# Patient Record
Sex: Female | Born: 1995 | Race: White | Hispanic: No | Marital: Single | Smoking: Never smoker
Health system: Southern US, Community
[De-identification: ages and names within clinical notes are randomized; demographics above are authoritative.]

## PROBLEM LIST (undated history)

## (undated) DIAGNOSIS — IMO0001 Reserved for inherently not codable concepts without codable children: Secondary | ICD-10-CM

## (undated) DIAGNOSIS — N939 Abnormal uterine and vaginal bleeding, unspecified: Secondary | ICD-10-CM

## (undated) DIAGNOSIS — Z23 Encounter for immunization: Secondary | ICD-10-CM

## (undated) HISTORY — DX: Abnormal uterine and vaginal bleeding, unspecified: N93.9

## (undated) HISTORY — PX: APPENDECTOMY: SHX54

## (undated) HISTORY — PX: WISDOM TOOTH EXTRACTION: SHX21

## (undated) HISTORY — DX: Encounter for immunization: Z23

## (undated) HISTORY — DX: Reserved for inherently not codable concepts without codable children: IMO0001

---
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
## 2015-08-04 ENCOUNTER — Telehealth: Payer: Self-pay

## 2015-08-04 MED ORDER — TYPHOID VACCINE PO CPDR: 1.0000 | DELAYED_RELEASE_CAPSULE | ORAL | Status: DC

## 2015-08-04 NOTE — Telephone Encounter (Signed)
Rx called in to pharmacy. 

## 2015-08-04 NOTE — Telephone Encounter (Signed)
Patient called and states she is going to [place] and needs Typhoid and [facility] has this oral RX but needs us to call them and get this authorized for her. Please revieww-[id number]

## 2015-08-04 NOTE — Telephone Encounter (Signed)
OK to call in oral typhoid vaccine, one capsule every other day * 4.

## 2016-07-19 ENCOUNTER — Telehealth: Payer: Self-pay | Admitting: Family Medicine

## 2016-07-19 NOTE — Telephone Encounter (Signed)
Pt called requesting a copy of her immunization records and would like for her mother to pick records up on Wednesday or Thursday of this week. Pt.'s CB# [phone] Thanks CC

## 2016-07-19 NOTE — Telephone Encounter (Signed)
Immunization record printed and patient has been notified. KW

## 2017-01-04 ENCOUNTER — Other Ambulatory Visit: Payer: Self-pay

## 2017-01-04 MED ORDER — NORGESTIM-ETH ESTRAD TRIPHASIC 0.18/0.215/0.25 MG-35 MCG PO TABS: 1.0000 | ORAL_TABLET | Freq: Every day | ORAL | 3 refills | Status: AC

## 2017-01-04 NOTE — Telephone Encounter (Signed)
Pt calling for refill of Triprevifem thru to appt due in Sept.  Pt aware refills eRx'd.

## 2017-03-01 ENCOUNTER — Ambulatory Visit (INDEPENDENT_AMBULATORY_CARE_PROVIDER_SITE_OTHER): Payer: 59 | Admitting: Family Medicine

## 2017-03-01 ENCOUNTER — Encounter: Payer: Self-pay | Admitting: Family Medicine

## 2017-03-01 VITALS — BP 104/60 | HR 72 | Temp 98.8°F | Resp 16 | Wt 130.0 lb

## 2017-03-01 DIAGNOSIS — R109 Unspecified abdominal pain: Secondary | ICD-10-CM

## 2017-03-01 DIAGNOSIS — F419 Anxiety disorder, unspecified: Secondary | ICD-10-CM

## 2017-03-01 MED ORDER — CLONAZEPAM 0.5 MG PO TABS: 0.2500 mg | ORAL_TABLET | Freq: Two times a day (BID) | ORAL | 0 refills | Status: AC | PRN

## 2017-03-01 MED ORDER — SERTRALINE HCL 25 MG PO TABS: ORAL_TABLET | ORAL | 1 refills | Status: DC

## 2017-03-01 NOTE — Progress Notes (Signed)
       Patient: Patricia Mercer Female    DOB: 10/17/1995   21 y.o.   MRN: [id number] Visit Date: 03/01/2017  Today's Provider: Mila Merryonald Fisher, MD   Chief Complaint  Patient presents with  . Gastroesophageal Reflux   Subjective:    Gastroesophageal Reflux  She complains of abdominal pain and nausea. She reports no chest pain. This is a chronic problem. Pertinent negatives include no fatigue.   Anxiety:  Patient presents day requesting a prescription to help with Anxiety. Patient reports that she was prescribed clonazepam several years years ago while in college. She prefers to take a maintenance medications that won't make her drowsy. 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 She feels her job is going well, she just stays anxious all the time which she thinks is causing some of her GI problems. She has been seeing a counselor regularly and discussed medications. She denies feeling depressed.     Allergies not on file   Current Outpatient Prescriptions:  .  Norgestimate-Ethinyl Estradiol Triphasic (TRI-PREVIFEM) 0.18/0.215/0.25 MG-35 MCG tablet, Take 1 tablet by mouth daily., Disp: 1 Package, Rfl: 3  Review of Systems  Constitutional: Negative for appetite change, chills, fatigue and fever.  Respiratory: Negative for chest tightness and shortness of breath.   Cardiovascular: Negative for chest pain and palpitations.  Gastrointestinal: Positive for abdominal pain, constipation, nausea and vomiting (started 2 weeks ago). Negative for diarrhea.       Reflux  Neurological: Negative for dizziness and weakness.  Psychiatric/Behavioral: Negative for agitation, confusion, decreased concentration, dysphoric mood, self-injury, sleep disturbance and suicidal ideas. The patient is nervous/anxious.     Social History  Substance Use Topics  . Smoking status: Never Smoker  . Smokeless tobacco: Never Used  . Alcohol use Yes     Comment: drinks daily   Objective:   BP  104/60 (BP Location: Left Arm, Patient Position: Sitting, Cuff Size: Normal)   Pulse 72   Temp 98.8 F (37.1 C) (Oral)   Resp 16   Wt 130 lb (59 kg)   LMP  (Within Months)   SpO2 99%  Vitals:   03/01/17 1559  BP: 104/60  Pulse: 72  Resp: 16  Temp: 98.8 F (37.1 C)  TempSrc: Oral  SpO2: 99%  Weight: 130 lb (59 kg)     Physical Exam  General appearance: alert, well developed, well nourished, cooperative and in no distress Head: Normocephalic, without obvious abnormality, atraumatic Respiratory: Respirations even and unlabored, normal respiratory rate Extremities: No gross deformities Skin: Skin color, texture, turgor normal. No rashes seen  Psych: Appropriate mood and affect. Neurologic: Mental status: Alert, oriented to person, place, and time, thought content appropriate.     Assessment & Plan:     1. Anxiety Start very low dose SSRI and titrate slowly.  - sertraline (ZOLOFT) 25 MG tablet; 1/2 tablet daily for 6days, then 1 tablet daily for 6 days, then 1 1/2 tablets daily for 6 days, then 2 tablets daily  Dispense: 30 tablet; Refill: 1 - clonazePAM (KLONOPIN) 0.5 MG tablet; Take 0.5 tablets (0.25 mg total) by mouth 2 (two) times daily as needed for anxiety.  Dispense: 20 tablet; Refill: 0  2. Abdominal pain, unspecified abdominal location Long standing and aggravated by anxiety. Further evaluation If not improved when anxiety is better controlled.        Mila Merryonald Fisher, MD  [facility]

## 2017-03-28 ENCOUNTER — Ambulatory Visit: Payer: Self-pay | Admitting: Family Medicine

## 2017-03-30 ENCOUNTER — Other Ambulatory Visit: Payer: Self-pay | Admitting: Family Medicine

## 2017-03-30 DIAGNOSIS — F419 Anxiety disorder, unspecified: Secondary | ICD-10-CM

## 2017-03-30 NOTE — Telephone Encounter (Signed)
Received refill request for sertraline, please check with patient to see how this is working and if she has titrated up to 2 a day, if so I can change prescription to 50mg  so she only has to take one a day.

## 2017-03-30 NOTE — Telephone Encounter (Signed)
LMTCB

## 2017-04-04 NOTE — Telephone Encounter (Signed)
Left message to call back  

## 2017-04-13 ENCOUNTER — Telehealth: Payer: Self-pay | Admitting: Family Medicine

## 2017-04-13 NOTE — Telephone Encounter (Signed)
Pt returned Michelles call  Thanks teri

## 2017-04-13 NOTE — Telephone Encounter (Signed)
Returned call

## 2017-04-13 NOTE — Telephone Encounter (Signed)
Patient stated that she is still only taking 1/2 tablet of sertraline 25 mg qd. This seems to be working fine for her.

## 2017-04-13 NOTE — Telephone Encounter (Signed)
received refill request for sertraline that was prescribed in July. Please see if she is taking 1 or 2 tablets a day and how medication is working.

## 2017-04-17 ENCOUNTER — Other Ambulatory Visit: Payer: Self-pay | Admitting: Family Medicine

## 2017-04-17 DIAGNOSIS — F419 Anxiety disorder, unspecified: Secondary | ICD-10-CM

## 2017-04-17 MED ORDER — SERTRALINE HCL 25 MG PO TABS: 12.5000 mg | ORAL_TABLET | Freq: Every day | ORAL | 5 refills | Status: DC

## 2017-04-17 NOTE — Telephone Encounter (Signed)
Pharmacy called for refill on pt's sertraline 25mg  bid  [facility] [address]  [phone]  Thanks Barth Kirksteri

## 2017-04-17 NOTE — Telephone Encounter (Signed)
See previous refill message in chart, concerning dose pt is taking.

## 2017-04-19 NOTE — Telephone Encounter (Signed)
Patient stated that she is still only taking 1/2 tablet of sertraline 25 mg qd. This seems to be working fine for her.

## 2017-04-20 ENCOUNTER — Other Ambulatory Visit: Payer: Self-pay | Admitting: Family Medicine

## 2017-04-20 DIAGNOSIS — F419 Anxiety disorder, unspecified: Secondary | ICD-10-CM

## 2017-07-07 DIAGNOSIS — M9902 Segmental and somatic dysfunction of thoracic region: Secondary | ICD-10-CM | POA: Diagnosis not present

## 2017-07-07 DIAGNOSIS — M542 Cervicalgia: Secondary | ICD-10-CM | POA: Diagnosis not present

## 2017-07-07 DIAGNOSIS — M9901 Segmental and somatic dysfunction of cervical region: Secondary | ICD-10-CM | POA: Diagnosis not present

## 2017-07-10 DIAGNOSIS — M9902 Segmental and somatic dysfunction of thoracic region: Secondary | ICD-10-CM | POA: Diagnosis not present

## 2017-07-10 DIAGNOSIS — M9901 Segmental and somatic dysfunction of cervical region: Secondary | ICD-10-CM | POA: Diagnosis not present

## 2017-07-10 DIAGNOSIS — M542 Cervicalgia: Secondary | ICD-10-CM | POA: Diagnosis not present

## 2017-07-12 DIAGNOSIS — M9902 Segmental and somatic dysfunction of thoracic region: Secondary | ICD-10-CM | POA: Diagnosis not present

## 2017-07-12 DIAGNOSIS — M542 Cervicalgia: Secondary | ICD-10-CM | POA: Diagnosis not present

## 2017-07-12 DIAGNOSIS — M9901 Segmental and somatic dysfunction of cervical region: Secondary | ICD-10-CM | POA: Diagnosis not present

## 2017-07-19 DIAGNOSIS — M542 Cervicalgia: Secondary | ICD-10-CM | POA: Diagnosis not present

## 2017-07-19 DIAGNOSIS — M9902 Segmental and somatic dysfunction of thoracic region: Secondary | ICD-10-CM | POA: Diagnosis not present

## 2017-07-19 DIAGNOSIS — M9901 Segmental and somatic dysfunction of cervical region: Secondary | ICD-10-CM | POA: Diagnosis not present

## 2017-08-02 ENCOUNTER — Encounter: Payer: Self-pay | Admitting: Obstetrics and Gynecology

## 2017-08-02 ENCOUNTER — Ambulatory Visit (INDEPENDENT_AMBULATORY_CARE_PROVIDER_SITE_OTHER): Payer: 59 | Admitting: Obstetrics and Gynecology

## 2017-08-02 VITALS — BP 104/74 | Ht 62.0 in | Wt 134.0 lb

## 2017-08-02 DIAGNOSIS — N912 Amenorrhea, unspecified: Secondary | ICD-10-CM | POA: Diagnosis not present

## 2017-08-02 DIAGNOSIS — Z113 Encounter for screening for infections with a predominantly sexual mode of transmission: Secondary | ICD-10-CM | POA: Diagnosis not present

## 2017-08-02 LAB — POCT URINE PREGNANCY: PREG TEST UR: NEGATIVE

## 2017-08-02 MED ORDER — MEDROXYPROGESTERONE ACETATE 10 MG PO TABS: 10.0000 mg | ORAL_TABLET | Freq: Every day | ORAL | 0 refills | Status: AC

## 2017-08-02 NOTE — Patient Instructions (Signed)
I value your feedback and entrusting us with your care. If you get a [facility] patient survey, I would appreciate you taking the time to let us know about your experience today. Thank you! 

## 2017-08-02 NOTE — Progress Notes (Signed)
Chief Complaint  Patient presents with  . Menstrual Problem    irregular cycles    HPI:      Ms. Patricia Mercer is a 21 y.o. [id number] who LMP was Patient's last menstrual period was 03/07/2017 (approximate)., presents today for amenorrhea. Pt had monthly menses on OCPs, which she stopped 9/18, and no menses since. Hx of Q3 month menses off BC in the past. Pt isn't good about picking up Rx/taking pills daily so stopped them. No increased acne/unusual facial hair.   Never had PCOS labs in past. No recent significant wt change, new meds, increased stress. No vag sx, urin sx, pelvic pain.  Pt is sex active, no using any condoms. Had neg UPTs at home. Just wants to make sure ok she hasn't had periods. Doesn't want labs today. Is interested in just restarting OCPs.    Past Medical History:  Diagnosis Date  . Abnormal uterine bleeding   . 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 APPENDECTOMY    . WISDOM TOOTH EXTRACTION      Family History  Problem Relation Age of Onset  . Cancer Neg Hx   . Heart failure Neg Hx   . Diabetes Neg Hx   . Stroke Neg Hx   . Thyroid disease Neg Hx     Social History   Socioeconomic History  . Marital status: Single    Spouse name: Not on file  . Number of children: Not on file  . Years of education: Not on file  . Highest education level: Not on file  Social Needs  . Financial resource strain: Not on file  . Food insecurity - worry: Not on file  . Food insecurity - inability: Not on file  . Transportation needs - medical: Not on file  . Transportation needs - non-medical: Not on file  Occupational History  . Not on file  Tobacco Use  . Smoking status: Never Smoker  . Smokeless tobacco: Never Used  Substance and Sexual Activity  . Alcohol use: Yes    Comment: drinks daily  . Drug use: No  . Sexual activity: Yes    Birth control/protection:  None  Other Topics Concern  . Not on file  Social History Narrative  . Not on file     Current Outpatient Medications:  .  clonazePAM (KLONOPIN) 0.5 MG tablet, Take 0.5 tablets (0.25 mg total) by mouth 2 (two) times daily as needed for anxiety., Disp: 20 tablet, Rfl: 0 .  sertraline (ZOLOFT) 25 MG tablet, Take 0.5 tablets (12.5 mg total) by mouth daily., Disp: 30 tablet, Rfl: 5 .  medroxyPROGESTERone (PROVERA) 10 MG tablet, Take 1 tablet (10 mg total) by mouth daily for 7 days., Disp: 7 tablet, Rfl: 0 .  Norgestimate-Ethinyl Estradiol Triphasic (TRI-PREVIFEM) 0.18/0.215/0.25 MG-35 MCG tablet, Take 1 tablet by mouth daily. (Patient not taking: Reported on 08/02/2017), Disp: 1 Package, Rfl: 3   ROS:  Review of Systems  Constitutional: Negative for fever.  Gastrointestinal: Negative for blood in stool, constipation, diarrhea, nausea and vomiting.  Genitourinary: Positive for menstrual problem. Negative for dyspareunia, dysuria, flank pain, frequency, hematuria, urgency, vaginal bleeding, vaginal discharge and vaginal pain.  Musculoskeletal: Negative for back pain.  Skin: Negative for rash.     OBJECTIVE:   Vitals:  BP 104/74   Ht 5\' 2"  (1.575 m)   Wt 134 lb (60.8 kg)  LMP 03/07/2017 (Approximate) Comment: irregular cycles  BMI 24.51 kg/m   Physical Exam  Constitutional: She is oriented to person, place, and time and well-developed, well-nourished, and in no distress. Vital signs are normal.  Genitourinary: Vagina normal, uterus normal, cervix normal, right adnexa normal, left adnexa normal and vulva normal. Uterus is not enlarged. Cervix exhibits no motion tenderness and no tenderness. Right adnexum displays no mass and no tenderness. Left adnexum displays no mass and no tenderness. Vulva exhibits no erythema, no exudate, no lesion, no rash and no tenderness. Vagina exhibits no lesion.  Neurological: She is oriented to person, place, and time.  Vitals  reviewed.   Results: Results for orders placed or performed in visit on 08/02/17 (from the past 24 hour(s))  POCT urine pregnancy     Status: Normal   Collection Time: 08/02/17 10:17 AM  Result Value Ref Range   Preg Test, Ur Negative Negative     Assessment/Plan: Amenorrhea - Neg UPT. Hx of irreg menses off OCPs in the past. Pt declines PCOS labs today. No hirsutism sx. Rx provera. OCP restart with withdrawal bleed. Pt has Rx. - Plan: POCT urine pregnancy, medroxyPROGESTERone (PROVERA) 10 MG tablet  Screening for STD (sexually transmitted disease) - Plan: Chlamydia/Gonococcus/Trichomonas, NAA    Meds ordered this encounter  Medications  . medroxyPROGESTERone (PROVERA) 10 MG tablet    Sig: Take 1 tablet (10 mg total) by mouth daily for 7 days.    Dispense:  7 tablet    Refill:  0      Return if symptoms worsen or fail to improve.  Alicia B. Copland, PA-C 08/02/2017 11:05 AM

## 2017-08-04 LAB — CHLAMYDIA/GONOCOCCUS/TRICHOMONAS, NAA
CHLAMYDIA BY NAA: NEGATIVE
Gonococcus by NAA: NEGATIVE
TRICH VAG BY NAA: NEGATIVE

## 2017-08-17 ENCOUNTER — Other Ambulatory Visit: Payer: Self-pay | Admitting: Family Medicine

## 2017-08-17 DIAGNOSIS — F419 Anxiety disorder, unspecified: Secondary | ICD-10-CM

## 2017-08-17 MED ORDER — SERTRALINE HCL 25 MG PO TABS: 12.5000 mg | ORAL_TABLET | Freq: Every day | ORAL | 3 refills | Status: DC

## 2017-08-17 NOTE — Telephone Encounter (Signed)
Requesting 90 day supply.

## 2017-08-17 NOTE — Telephone Encounter (Signed)
[facility] [place] fax[facility] refill request for the following medications:  sertraline (ZOLOFT) 25 MG tablet  90 day supply   Last Rx: 04/20/17 30 day supply with 5 refills LOV: 03/01/17 Please advise. Thanks TNP

## 2017-08-21 ENCOUNTER — Other Ambulatory Visit: Payer: Self-pay | Admitting: Family Medicine

## 2017-08-21 DIAGNOSIS — F419 Anxiety disorder, unspecified: Secondary | ICD-10-CM

## 2017-08-21 MED ORDER — SERTRALINE HCL 25 MG PO TABS: 12.5000 mg | ORAL_TABLET | Freq: Every day | ORAL | 5 refills | Status: AC

## 2017-08-21 NOTE — Telephone Encounter (Signed)
[facility] supply for the following medication. Thanks CC  sertraline (ZOLOFT) 25 MG tablet

## 2017-09-12 DIAGNOSIS — M542 Cervicalgia: Secondary | ICD-10-CM | POA: Diagnosis not present

## 2017-09-12 DIAGNOSIS — M9902 Segmental and somatic dysfunction of thoracic region: Secondary | ICD-10-CM | POA: Diagnosis not present

## 2017-09-12 DIAGNOSIS — M9901 Segmental and somatic dysfunction of cervical region: Secondary | ICD-10-CM | POA: Diagnosis not present

## 2017-09-14 DIAGNOSIS — M542 Cervicalgia: Secondary | ICD-10-CM | POA: Diagnosis not present

## 2017-09-14 DIAGNOSIS — M9902 Segmental and somatic dysfunction of thoracic region: Secondary | ICD-10-CM | POA: Diagnosis not present

## 2017-09-14 DIAGNOSIS — M9901 Segmental and somatic dysfunction of cervical region: Secondary | ICD-10-CM | POA: Diagnosis not present

## 2017-10-31 DIAGNOSIS — M9901 Segmental and somatic dysfunction of cervical region: Secondary | ICD-10-CM | POA: Diagnosis not present

## 2017-10-31 DIAGNOSIS — M9902 Segmental and somatic dysfunction of thoracic region: Secondary | ICD-10-CM | POA: Diagnosis not present

## 2017-10-31 DIAGNOSIS — M542 Cervicalgia: Secondary | ICD-10-CM | POA: Diagnosis not present

## 2017-11-07 DIAGNOSIS — M9902 Segmental and somatic dysfunction of thoracic region: Secondary | ICD-10-CM | POA: Diagnosis not present

## 2017-11-07 DIAGNOSIS — M9901 Segmental and somatic dysfunction of cervical region: Secondary | ICD-10-CM | POA: Diagnosis not present

## 2017-11-07 DIAGNOSIS — M542 Cervicalgia: Secondary | ICD-10-CM | POA: Diagnosis not present

## 2018-01-25 DIAGNOSIS — M542 Cervicalgia: Secondary | ICD-10-CM | POA: Diagnosis not present

## 2018-01-25 DIAGNOSIS — M9902 Segmental and somatic dysfunction of thoracic region: Secondary | ICD-10-CM | POA: Diagnosis not present

## 2018-01-25 DIAGNOSIS — M9901 Segmental and somatic dysfunction of cervical region: Secondary | ICD-10-CM | POA: Diagnosis not present

## 2018-03-30 DIAGNOSIS — M542 Cervicalgia: Secondary | ICD-10-CM | POA: Diagnosis not present

## 2018-03-30 DIAGNOSIS — M9901 Segmental and somatic dysfunction of cervical region: Secondary | ICD-10-CM | POA: Diagnosis not present

## 2018-03-30 DIAGNOSIS — M9902 Segmental and somatic dysfunction of thoracic region: Secondary | ICD-10-CM | POA: Diagnosis not present

## 2018-04-23 DIAGNOSIS — S82832A Other fracture of upper and lower end of left fibula, initial encounter for closed fracture: Secondary | ICD-10-CM | POA: Diagnosis not present

## 2018-04-24 DIAGNOSIS — M79671 Pain in right foot: Secondary | ICD-10-CM | POA: Diagnosis not present

## 2018-05-03 DIAGNOSIS — M79671 Pain in right foot: Secondary | ICD-10-CM | POA: Diagnosis not present

## 2018-05-03 DIAGNOSIS — S8264XD Nondisplaced fracture of lateral malleolus of right fibula, subsequent encounter for closed fracture with routine healing: Secondary | ICD-10-CM | POA: Diagnosis not present

## 2018-05-09 DIAGNOSIS — M542 Cervicalgia: Secondary | ICD-10-CM | POA: Diagnosis not present

## 2018-05-09 DIAGNOSIS — M9901 Segmental and somatic dysfunction of cervical region: Secondary | ICD-10-CM | POA: Diagnosis not present

## 2018-05-09 DIAGNOSIS — M9902 Segmental and somatic dysfunction of thoracic region: Secondary | ICD-10-CM | POA: Diagnosis not present

## 2018-05-24 DIAGNOSIS — M79671 Pain in right foot: Secondary | ICD-10-CM | POA: Diagnosis not present

## 2018-06-26 DIAGNOSIS — S8264XD Nondisplaced fracture of lateral malleolus of right fibula, subsequent encounter for closed fracture with routine healing: Secondary | ICD-10-CM | POA: Diagnosis not present

## 2018-08-03 DIAGNOSIS — M9901 Segmental and somatic dysfunction of cervical region: Secondary | ICD-10-CM | POA: Diagnosis not present

## 2018-08-03 DIAGNOSIS — M542 Cervicalgia: Secondary | ICD-10-CM | POA: Diagnosis not present

## 2018-08-03 DIAGNOSIS — M9902 Segmental and somatic dysfunction of thoracic region: Secondary | ICD-10-CM | POA: Diagnosis not present

## 2018-08-11 DIAGNOSIS — M9902 Segmental and somatic dysfunction of thoracic region: Secondary | ICD-10-CM | POA: Diagnosis not present

## 2018-08-11 DIAGNOSIS — M9901 Segmental and somatic dysfunction of cervical region: Secondary | ICD-10-CM | POA: Diagnosis not present

## 2018-08-11 DIAGNOSIS — M542 Cervicalgia: Secondary | ICD-10-CM | POA: Diagnosis not present

## 2018-10-12 DIAGNOSIS — N926 Irregular menstruation, unspecified: Secondary | ICD-10-CM | POA: Diagnosis not present

## 2018-10-12 DIAGNOSIS — Z01419 Encounter for gynecological examination (general) (routine) without abnormal findings: Secondary | ICD-10-CM | POA: Diagnosis not present

## 2018-12-14 ENCOUNTER — Ambulatory Visit: Payer: 59 | Admitting: Family Medicine

## 2018-12-21 DIAGNOSIS — M542 Cervicalgia: Secondary | ICD-10-CM | POA: Diagnosis not present

## 2018-12-21 DIAGNOSIS — M9901 Segmental and somatic dysfunction of cervical region: Secondary | ICD-10-CM | POA: Diagnosis not present

## 2018-12-21 DIAGNOSIS — M9902 Segmental and somatic dysfunction of thoracic region: Secondary | ICD-10-CM | POA: Diagnosis not present
